# Patient Record
Sex: Male | Born: 1950 | Race: White | Hispanic: No | Marital: Married | Smoking: Current every day smoker
Health system: Southern US, Community
[De-identification: ages and names within clinical notes are randomized; demographics above are authoritative.]

## PROBLEM LIST (undated history)

## (undated) DIAGNOSIS — I10 Essential (primary) hypertension: Secondary | ICD-10-CM

## (undated) DIAGNOSIS — I2510 Atherosclerotic heart disease of native coronary artery without angina pectoris: Secondary | ICD-10-CM

## (undated) DIAGNOSIS — G4733 Obstructive sleep apnea (adult) (pediatric): Secondary | ICD-10-CM

## (undated) DIAGNOSIS — E119 Type 2 diabetes mellitus without complications: Secondary | ICD-10-CM

## (undated) DIAGNOSIS — K760 Fatty (change of) liver, not elsewhere classified: Secondary | ICD-10-CM

## (undated) HISTORY — PX: RIGHT AND LEFT HEART CATH: CATH118262

---

## 2007-12-25 ENCOUNTER — Other Ambulatory Visit: Payer: Self-pay

## 2007-12-25 ENCOUNTER — Emergency Department: Payer: Self-pay | Admitting: Emergency Medicine

## 2013-12-29 ENCOUNTER — Emergency Department: Payer: Self-pay | Admitting: Emergency Medicine

## 2013-12-29 LAB — CBC WITH DIFFERENTIAL/PLATELET
Basophil #: 0.1 10*3/uL (ref 0.0–0.1)
Basophil %: 0.8 %
Eosinophil #: 0.2 10*3/uL (ref 0.0–0.7)
Eosinophil %: 1.7 %
HCT: 39.5 % — AB (ref 40.0–52.0)
HGB: 13 g/dL (ref 13.0–18.0)
LYMPHS ABS: 2.2 10*3/uL (ref 1.0–3.6)
Lymphocyte %: 24.3 %
MCH: 28 pg (ref 26.0–34.0)
MCHC: 33 g/dL (ref 32.0–36.0)
MCV: 85 fL (ref 80–100)
MONOS PCT: 4.7 %
Monocyte #: 0.4 x10 3/mm (ref 0.2–1.0)
NEUTROS ABS: 6.2 10*3/uL (ref 1.4–6.5)
Neutrophil %: 68.5 %
Platelet: 183 10*3/uL (ref 150–440)
RBC: 4.66 10*6/uL (ref 4.40–5.90)
RDW: 13.3 % (ref 11.5–14.5)
WBC: 9.1 10*3/uL (ref 3.8–10.6)

## 2013-12-29 LAB — COMPREHENSIVE METABOLIC PANEL
ALT: 69 U/L (ref 12–78)
ANION GAP: 4 — AB (ref 7–16)
AST: 85 U/L — AB (ref 15–37)
Albumin: 3.4 g/dL (ref 3.4–5.0)
Alkaline Phosphatase: 72 U/L
BUN: 10 mg/dL (ref 7–18)
Bilirubin,Total: 0.4 mg/dL (ref 0.2–1.0)
CALCIUM: 8.7 mg/dL (ref 8.5–10.1)
CHLORIDE: 103 mmol/L (ref 98–107)
CO2: 28 mmol/L (ref 21–32)
Creatinine: 0.98 mg/dL (ref 0.60–1.30)
EGFR (Non-African Amer.): >60
GLUCOSE: 288 mg/dL — AB (ref 65–99)
Osmolality: 280 (ref 275–301)
Potassium: 3.9 mmol/L (ref 3.5–5.1)
Sodium: 135 mmol/L — ABNORMAL LOW (ref 136–145)
Total Protein: 7.5 g/dL (ref 6.4–8.2)

## 2013-12-29 LAB — URIC ACID: URIC ACID: 4.3 mg/dL (ref 3.5–7.2)

## 2019-09-08 ENCOUNTER — Ambulatory Visit: Payer: Medicare Other | Attending: Internal Medicine

## 2019-09-08 DIAGNOSIS — Z23 Encounter for immunization: Secondary | ICD-10-CM

## 2019-09-08 NOTE — Progress Notes (Signed)
   Covid-19 Vaccination Clinic  Name:  Jacob Keith    MRN: [id number] DOB: 1951-01-07  09/08/2019  Mr. Jacob Keith was observed post Covid-19 immunization for 15 minutes without incidence. He was provided with Vaccine Information Sheet and instruction to access the V-Safe system.   Mr. Jacob Keith was instructed to call 911 with any severe reactions post vaccine: [name] Kitchen Difficulty breathing  . Swelling of your face and throat  . A fast heartbeat  . A bad rash all over your body  . Dizziness and weakness    Immunizations Administered    Name Date Dose VIS Date Route   Pfizer COVID-19 Vaccine 09/08/2019 11:59 AM 0.3 mL 07/07/2019 Intramuscular   Manufacturer: [employer], [employer]   Lot: [id number]   NDC: [address]-6

## 2019-10-04 ENCOUNTER — Ambulatory Visit: Payer: Medicare Other

## 2019-10-10 ENCOUNTER — Ambulatory Visit: Payer: Medicare Other | Attending: Internal Medicine

## 2019-10-10 DIAGNOSIS — Z23 Encounter for immunization: Secondary | ICD-10-CM

## 2019-10-10 NOTE — Progress Notes (Signed)
   Covid-19 Vaccination Clinic  Name:  Jacob Keith    MRN: [id number] DOB: Jul 01, 1951  10/10/2019  [name] was observed post Covid-19 immunization for 15 minutes without incident. He was provided with Vaccine Information Sheet and instruction to access the V-Safe system.   [name] was instructed to call 911 with any severe reactions post vaccine: [name] Kitchen Difficulty breathing  . Swelling of face and throat  . A fast heartbeat  . A bad rash all over body  . Dizziness and weakness   Immunizations Administered    Name Date Dose VIS Date Route   Pfizer COVID-19 Vaccine 10/10/2019 11:21 AM 0.3 mL 07/07/2019 Intramuscular   Manufacturer: [employer], [employer]   Lot: [id number]   NDC: [address]-7

## 2021-08-25 ENCOUNTER — Ambulatory Visit
Admission: EM | Admit: 2021-08-25 | Discharge: 2021-08-25 | Disposition: A | Discharge status: Home / Self Care | Payer: Medicare HMO

## 2021-08-25 ENCOUNTER — Other Ambulatory Visit: Payer: Self-pay

## 2021-08-25 ENCOUNTER — Ambulatory Visit (INDEPENDENT_AMBULATORY_CARE_PROVIDER_SITE_OTHER): Payer: Medicare HMO

## 2021-08-25 DIAGNOSIS — J069 Acute upper respiratory infection, unspecified: Secondary | ICD-10-CM | POA: Diagnosis not present

## 2021-08-25 DIAGNOSIS — R059 Cough, unspecified: Secondary | ICD-10-CM

## 2021-08-25 DIAGNOSIS — Z20822 Contact with and (suspected) exposure to covid-19: Secondary | ICD-10-CM | POA: Diagnosis not present

## 2021-08-25 DIAGNOSIS — R0989 Other specified symptoms and signs involving the circulatory and respiratory systems: Secondary | ICD-10-CM

## 2021-08-25 MED ORDER — MOLNUPIRAVIR EUA 200MG CAPSULE: 4.0000 | ORAL_CAPSULE | Freq: Two times a day (BID) | ORAL | 0 refills | Status: AC

## 2021-08-25 MED ORDER — TIZANIDINE HCL 2 MG PO TABS: 2.0000 mg | ORAL_TABLET | Freq: Three times a day (TID) | ORAL | 0 refills | Status: AC

## 2021-08-25 NOTE — Discharge Instructions (Signed)
Covid is a virus and should steadily improve in time it can take up to 7 to 10 days before you truly start to see a turnaround however things will get better  Chest x-ray negative   Take antiviral twice a day for 5 days  May use muscle relaxer three times a  day as needed  May attempt any of the following below.     You can take Tylenol and/or Ibuprofen as needed for fever reduction and pain relief.   For cough: honey 1/2 to 1 teaspoon (you can dilute the honey in water or another fluid).  You can also use guaifenesin and dextromethorphan for cough. You can use a humidifier for chest congestion and cough.  If you don't have a humidifier, you can sit in the bathroom with the hot shower running.      For sore throat: try warm salt water gargles, cepacol lozenges, throat spray, warm tea or water with lemon/honey, popsicles or ice, or OTC cold relief medicine for throat discomfort.   For congestion: take a daily anti-histamine like Zyrtec, Claritin, and a oral decongestant, such as pseudoephedrine.  You can also use Flonase 1-2 sprays in each nostril daily.   It is important to stay hydrated: drink plenty of fluids (water, gatorade/powerade/pedialyte, juices, or teas) to keep your throat moisturized and help further relieve irritation/discomfort.

## 2021-08-25 NOTE — ED Triage Notes (Signed)
PT has been sick since Wednesday. Reports cough, chest congestion, and body aches. His daughter tested positive for COVID yesterday.

## 2021-08-25 NOTE — ED Provider Notes (Signed)
[facility]    CSN: [id number] Arrival date & time: 08/25/21  2876      History   Chief Complaint Chief Complaint  Patient presents with   Cough   Joint Pain    HPI Jacob Keith is a 71 y.o. male.   Patient presents with chills, fever, nasal congestion, rhinorrhea, sore throat, nonproductive cough, chest tightness and generalized headaches for 6 days. Known covid exposure. Has been using mucinex, aspirin which has been helpful.  History of hyperlipidemia, hypertension and diabetes.  No past medical history on file.  There are no problems to display for this patient.        Home Medications    Prior to Admission medications   Medication Sig Start Date End Date Taking? Authorizing Provider  albuterol (VENTOLIN HFA) 108 (90 Base) MCG/ACT inhaler 1 to 2 puffs as needed 05/19/10  Yes [provider]  aspirin 81 MG EC tablet Take 1 tablet by mouth daily. 08/26/18  Yes [provider]  venlafaxine XR (EFFEXOR-XR) 150 MG 24 hr capsule Take by mouth. 05/19/10  Yes [provider]  amLODipine (NORVASC) 5 MG tablet Take 5 mg by mouth daily. 07/14/21   [provider]  atorvastatin (LIPITOR) 10 MG tablet Take 10 mg by mouth daily. 07/24/21   [provider]  clonazePAM (KLONOPIN) 0.5 MG tablet Take 0.5 mg by mouth 3 (three) times daily as needed. 08/04/21   [provider]  gabapentin (NEURONTIN) 300 MG capsule Take by mouth. 05/12/21   [provider]  Magnesium 200 MG TABS Take by mouth.    [provider]  metFORMIN (GLUCOPHAGE) 1000 MG tablet Take 1,000 mg by mouth 2 (two) times daily. 07/12/21   [provider]  Oxycodone HCl 10 MG TABS Take 10 mg by mouth 3 (three) times daily as needed. 08/21/21   [provider]  tamsulosin (FLOMAX) 0.4 MG CAPS capsule Take 0.8 mg by mouth daily. 07/14/21   [provider]    Family History No family history on file.  Social  History     Allergies   Baclofen, Fentanyl, Sulfa antibiotics, and Doxycycline   Review of Systems Review of Systems  Constitutional:  Positive for chills and fever. Negative for activity change, appetite change, diaphoresis, fatigue and unexpected weight change.  HENT:  Positive for congestion, rhinorrhea and sore throat. Negative for dental problem, drooling, ear discharge, ear pain, facial swelling, hearing loss, mouth sores, nosebleeds, postnasal drip, sinus pressure, sinus pain, sneezing, tinnitus, trouble swallowing and voice change.   Respiratory:  Positive for cough and chest tightness. Negative for apnea, choking, shortness of breath, wheezing and stridor.   Cardiovascular: Negative.   Gastrointestinal: Negative.   Skin: Negative.   Neurological:  Positive for headaches. Negative for dizziness, tremors, seizures, syncope, facial asymmetry, speech difficulty, weakness, light-headedness and numbness.    Physical Exam Triage Vital Signs ED Triage Vitals  Enc Vitals Group     BP 08/25/21 0842 129/75     Pulse Rate 08/25/21 0842 65     Resp 08/25/21 0842 16     Temp 08/25/21 0842 99.5 F (37.5 C)     Temp Source 08/25/21 0842 Oral     SpO2 08/25/21 0842 99 %     Weight --      Height --      Head Circumference --      Peak Flow --      Pain Score 08/25/21 0837 6  Pain Loc --      Pain Edu? --      Excl. in [place]? --    No data found.  Updated Vital Signs BP 129/75    Pulse 65    Temp 99.5 F (37.5 C) (Oral)    Resp 16    SpO2 99%   Visual Acuity Right Eye Distance:   Left Eye Distance:   Bilateral Distance:    Right Eye Near:   Left Eye Near:    Bilateral Near:     Physical Exam Constitutional:      Appearance: Normal appearance.  HENT:     Head: Normocephalic.     Right Ear: Tympanic membrane, ear canal and external ear normal.     Left Ear: Tympanic membrane, ear canal and external ear normal.     Nose: Congestion and rhinorrhea present.      Mouth/Throat:     Mouth: Mucous membranes are moist.     Pharynx: Oropharynx is clear.  Eyes:     Extraocular Movements: Extraocular movements intact.  Cardiovascular:     Rate and Rhythm: Normal rate and regular rhythm.     Pulses: Normal pulses.     Heart sounds: Normal heart sounds.  Pulmonary:     Breath sounds: Examination of the right-lower field reveals rhonchi. Examination of the left-lower field reveals rhonchi. Rhonchi present.  Musculoskeletal:     Cervical back: Normal range of motion and neck supple.  Skin:    General: Skin is warm and dry.  Neurological:     Mental Status: He is alert and oriented to person, place, and time. Mental status is at baseline.  Psychiatric:        Mood and Affect: Mood normal.        Behavior: Behavior normal.     UC Treatments / Results  Labs (all labs ordered are listed, but only abnormal results are displayed) Labs Reviewed - No data to display  EKG   Radiology No results found.  Procedures Procedures (including critical care time)  Medications Ordered in UC Medications - No data to display  Initial Impression / Assessment and Plan / UC Course  I have reviewed the triage vital signs and the nursing notes.  Pertinent labs & imaging results that were available during my care of the patient were reviewed by me and considered in my medical decision making (see chart for details).  Viral URI with cough Exposure to COVID  Vital signs are stable, patient is in no signs of distress, based on medical history and age, will move forward with antiviral treatment, discussed administration, chest x-ray negative, discussed findings with patient, prescribed Zanaflex as body aches are most worrisome symptom, may continue use of over-the-counter medication for further symptom management, urgent care follow-up as needed Final Clinical Impressions(s) / UC Diagnoses   Final diagnoses:  None   Discharge Instructions   None    ED  Prescriptions   None    PDMP not reviewed this encounter.   Hans Eden, [place] 08/25/21 [phone]

## 2023-04-27 ENCOUNTER — Ambulatory Visit
Admission: EM | Admit: 2023-04-27 | Discharge: 2023-04-27 | Payer: Medicare HMO | Attending: Emergency Medicine | Admitting: Emergency Medicine

## 2023-04-27 ENCOUNTER — Encounter: Payer: Self-pay | Admitting: Emergency Medicine

## 2023-04-27 DIAGNOSIS — R079 Chest pain, unspecified: Secondary | ICD-10-CM | POA: Diagnosis not present

## 2023-04-27 HISTORY — DX: Atherosclerotic heart disease of native coronary artery without angina pectoris: I25.10

## 2023-04-27 HISTORY — DX: Fatty (change of) liver, not elsewhere classified: K76.0

## 2023-04-27 HISTORY — DX: Type 2 diabetes mellitus without complications: E11.9

## 2023-04-27 HISTORY — DX: Obstructive sleep apnea (adult) (pediatric): G47.33

## 2023-04-27 HISTORY — DX: Essential (primary) hypertension: I10

## 2023-04-27 NOTE — ED Triage Notes (Signed)
Pt presents with a cough, SOB and fever that started today. Pt states the right side of his chest hurts when he coughs.

## 2023-04-27 NOTE — ED Notes (Signed)
Patient is being discharged from the Urgent Care and sent to the Emergency Department via personal vehicle . Per Harl Bowie NP, patient is in need of higher level of care due to chest pain, SOB. Patient is aware and verbalizes understanding of plan of care.  Vitals:   04/27/23 1828  BP: (!) 163/71  Pulse: 73  Resp: 18  Temp: (!) 100.5 F (38.1 C)  SpO2: 95%

## 2023-04-27 NOTE — Discharge Instructions (Signed)
Please go to ER for further evaluation, do not drink or eat anything until cleared by ER provider.

## 2023-04-27 NOTE — ED Provider Notes (Signed)
[facility]    CSN: [id number] Arrival date & time: 04/27/23  1819      History   Chief Complaint Chief Complaint  Patient presents with   Cough   Fever   Shortness of Breath    HPI Jacob Keith is a 72 y.o. male.   Jacob Keith is an 72 y.o. male patient with a past medical history of HTN, CAD, OSA with CPAP, and hepatic steatosis, presents to urgent care for evaluation of cough, SOB and fever that started today. Pt states the right side of his chest hurts when he coughs, feels like a band around right side of chest. Pt took tylenol earlier today.   Heart cath 04/10/2023, daughter with pt  The history is provided by the patient and a relative. No language interpreter was used.  Cough Associated symptoms: fever and shortness of breath   Fever Associated symptoms: cough   Shortness of Breath Associated symptoms: cough and fever     Past Medical History:  Diagnosis Date   Coronary artery disease    Fatty liver    Hypertension    OSA on CPAP     Patient Active Problem List   Diagnosis Date Noted   Right-sided chest pain 04/27/2023    Past Surgical History:  Procedure Laterality Date   RIGHT AND LEFT HEART CATH         Home Medications    Prior to Admission medications   Medication Sig Start Date End Date Taking? Authorizing Provider  albuterol (VENTOLIN HFA) 108 (90 Base) MCG/ACT inhaler 1 to 2 puffs as needed 05/19/10   [provider]  amLODipine (NORVASC) 5 MG tablet Take 5 mg by mouth daily. 07/14/21   [provider]  aspirin 81 MG EC tablet Take 1 tablet by mouth daily. 08/26/18   [provider]  atorvastatin (LIPITOR) 10 MG tablet Take 10 mg by mouth daily. 07/24/21   [provider]  clonazePAM (KLONOPIN) 0.5 MG tablet Take 0.5 mg by mouth 3 (three) times daily as needed. 08/04/21   [provider]  gabapentin (NEURONTIN) 300 MG capsule Take by mouth. 05/12/21   [provider]  Magnesium 200 MG TABS Take by mouth.    [provider]  metFORMIN (GLUCOPHAGE) 1000 MG tablet Take 1,000 mg by mouth 2 (two) times daily. 07/12/21   [provider]  Oxycodone HCl 10 MG TABS Take 10 mg by mouth 3 (three) times daily as needed. 08/21/21   [provider]  tamsulosin (FLOMAX) 0.4 MG CAPS capsule Take 0.8 mg by mouth daily. 07/14/21   [provider]  tiZANidine (ZANAFLEX) 2 MG tablet Take 1 tablet (2 mg total) by mouth 3 (three) times daily. 08/25/21   Valinda Hoar, NP  venlafaxine XR (EFFEXOR-XR) 150 MG 24 hr capsule Take by mouth. 05/19/10   [provider]    Family History History reviewed. No pertinent family history.  Social History Social History   Tobacco Use   Smoking status: Former    Types: Cigarettes   Smokeless tobacco: Never  Vaping Use   Vaping status: Former  Substance Use Topics   Alcohol use: Not Currently     Allergies   Baclofen, Fentanyl, Sulfa antibiotics, Oxycodone-acetaminophen, and Doxycycline   Review of Systems Review of Systems  Constitutional:  Positive for fever.  Respiratory:  Positive for cough, chest tightness and shortness of breath.   Cardiovascular:  Negative for palpitations.  All other  systems reviewed and are negative.    Physical Exam Triage Vital Signs ED Triage Vitals [04/27/23 1825]  Encounter Vitals Group     BP      Systolic BP Percentile      Diastolic BP Percentile      Pulse      Resp      Temp      Temp src      SpO2      Weight      Height      Head Circumference      Peak Flow      Pain Score 6     Pain Loc      Pain Education      Exclude from Growth Chart    No data found.  Updated Vital Signs BP (!) 163/71 (BP Location: Left Arm)   Pulse 73   Temp (!) 100.5 F (38.1 C)   Resp 18   SpO2 95%   Visual Acuity Right Eye Distance:   Left Eye Distance:   Bilateral Distance:    Right Eye Near:   Left Eye Near:    Bilateral  Near:     Physical Exam Vitals and nursing note reviewed.  Cardiovascular:     Rate and Rhythm: Normal rate and regular rhythm.     Heart sounds: Normal heart sounds.  Pulmonary:     Effort: Pulmonary effort is normal.     Breath sounds: Examination of the right-lower field reveals decreased breath sounds. Examination of the left-lower field reveals decreased breath sounds. Decreased breath sounds present.  Neurological:     General: No focal deficit present.     Mental Status: He is alert and oriented to person, place, and time.     GCS: GCS eye subscore is 4. GCS verbal subscore is 5. GCS motor subscore is 6.  Psychiatric:        Attention and Perception: Attention normal.        Mood and Affect: Mood normal.        Speech: Speech normal.        Behavior: Behavior normal.      UC Treatments / Results  Labs (all labs ordered are listed, but only abnormal results are displayed) Labs Reviewed - No data to display  EKG   Radiology No results found.  Procedures Procedures (including critical care time)  Medications Ordered in UC Medications - No data to display  Initial Impression / Assessment and Plan / UC Course  I have reviewed the triage vital signs and the nursing notes.  Pertinent labs & imaging results that were available during my care of the patient were reviewed by me and considered in my medical decision making (see chart for details).  Clinical Course as of 04/27/23 1852  Tue Apr 27, 2023  1841 Hr shows NSR w artifact noted, rate 80, no STEMI, no recent EKG to compare in epic noted.  Daughter with patient will transport to ER. [JD]    Clinical Course User Index [JD] Emmilynn Marut, Para March, NP    Ddx: Right sided chest pain, Viral URI,pneumonia Final Clinical Impressions(s) / UC Diagnoses   Final diagnoses:  Right-sided chest pain     Discharge Instructions      Please go to ER for further evaluation, do not drink or eat anything until cleared by ER  provider.     ED Prescriptions   None    PDMP not reviewed this encounter.   Harleyquinn Gasser, Para March,  NP 04/27/23 1852

## 2023-06-11 IMAGING — CR DG CHEST 2V
4 series · 4 of 4 positions shown · non-contrast
Comparison: 12/25/2007

CLINICAL DATA: Rhonchi.  Cough and chest congestion.

EXAM:
CHEST - 2 VIEW

[chest pa (1 of 2)]
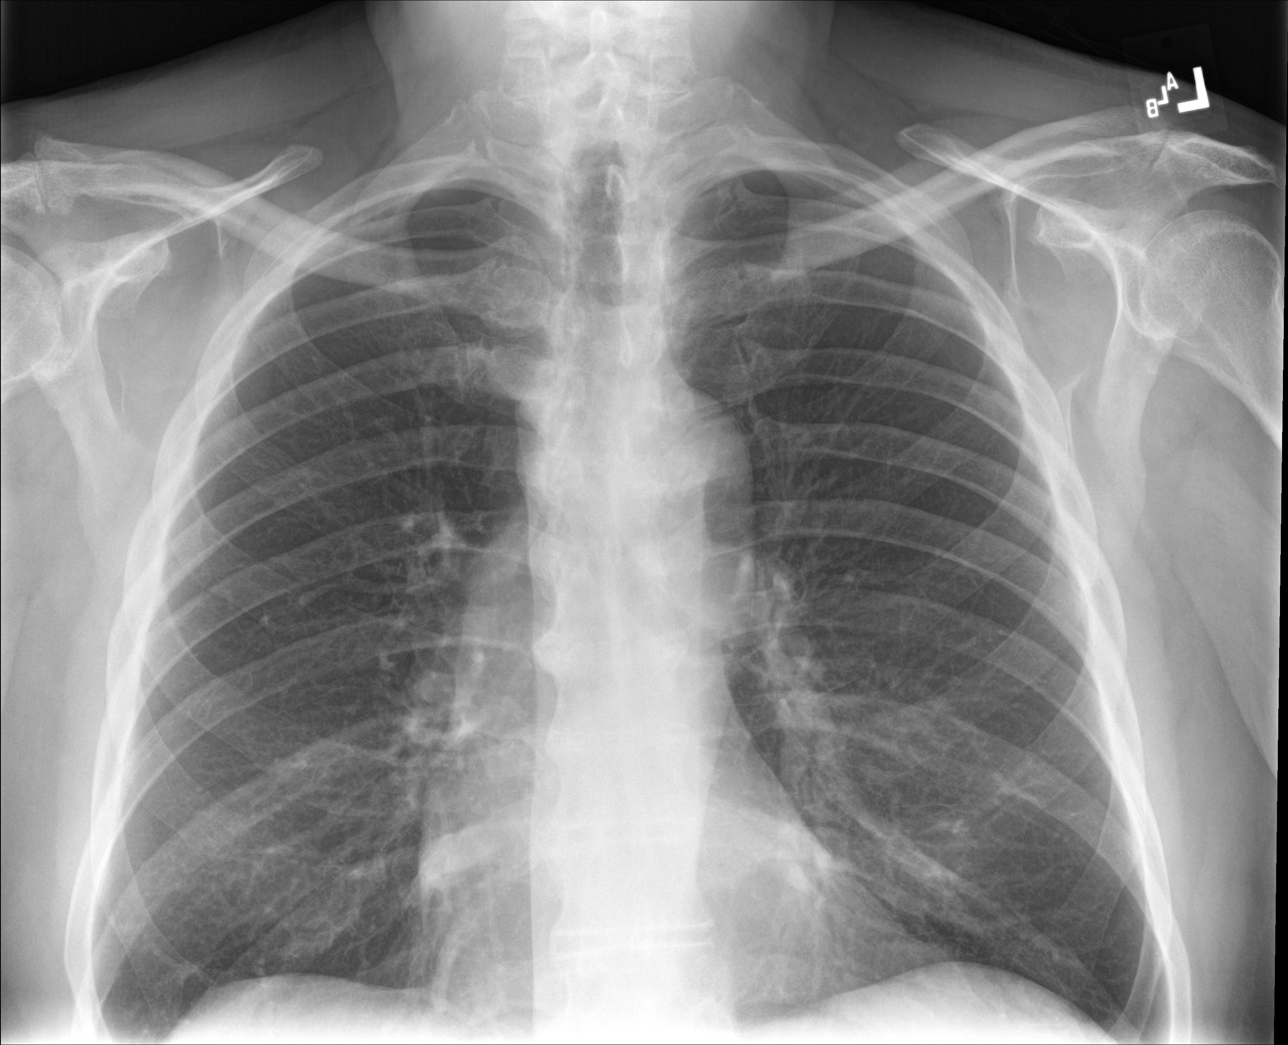

[chest lat (1 of 2)]
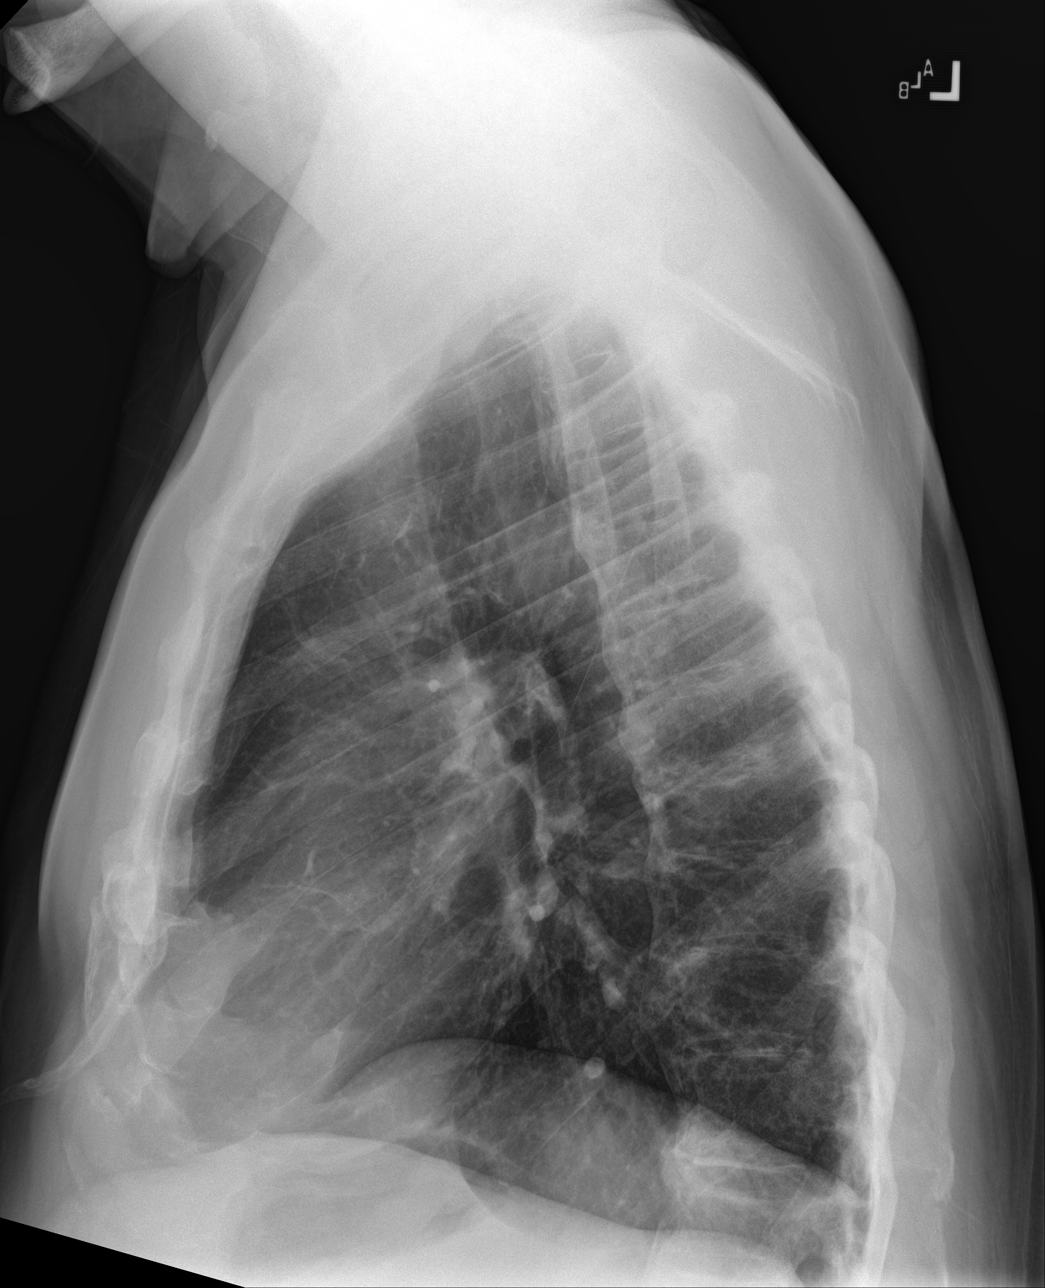

[chest pa (2 of 2)]
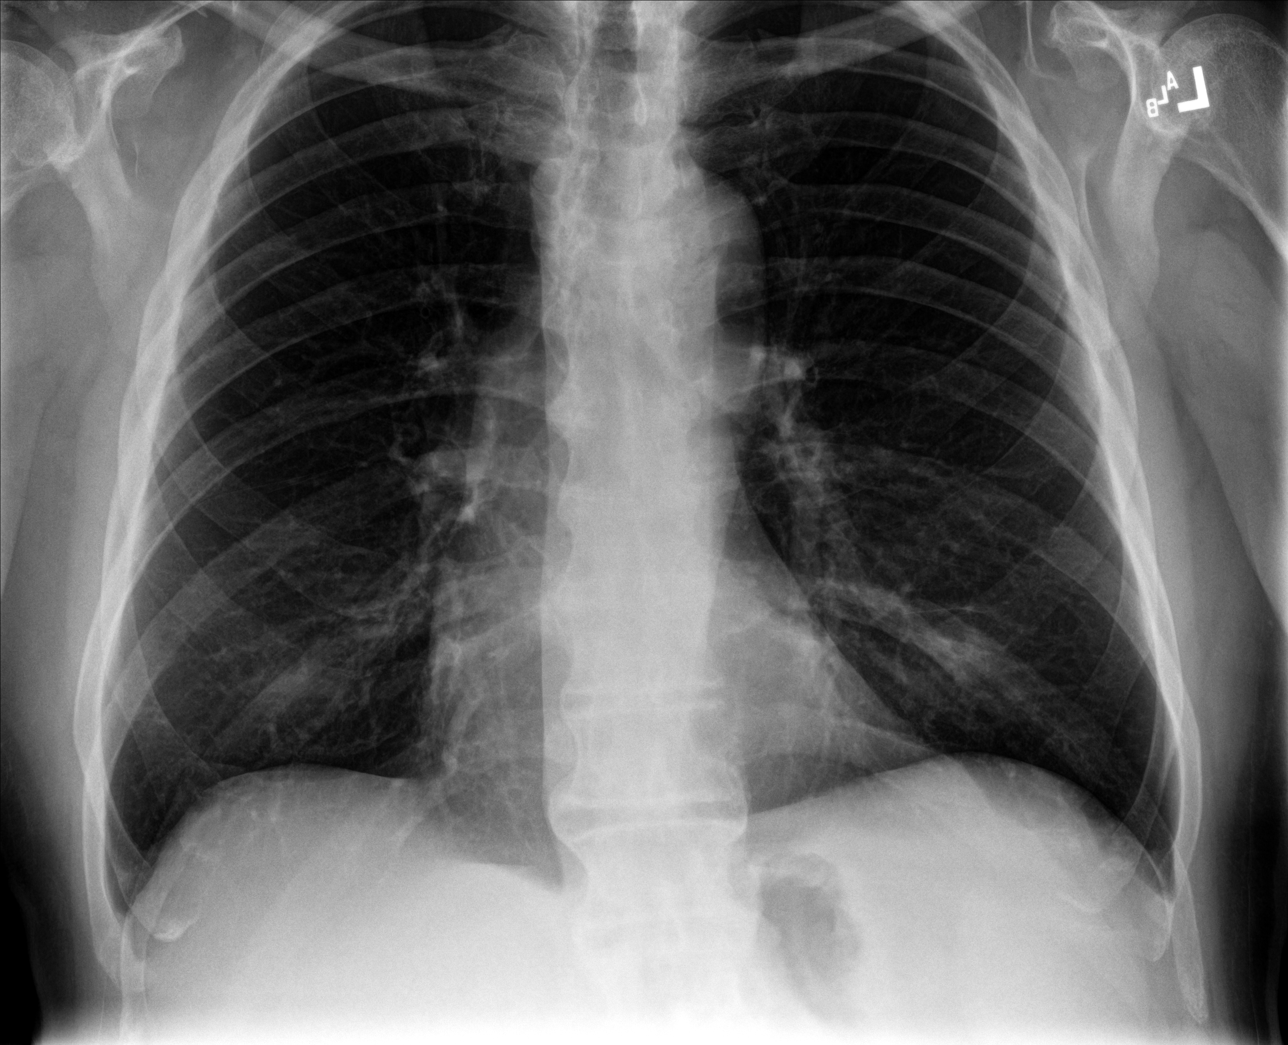

[chest lat (2 of 2)]
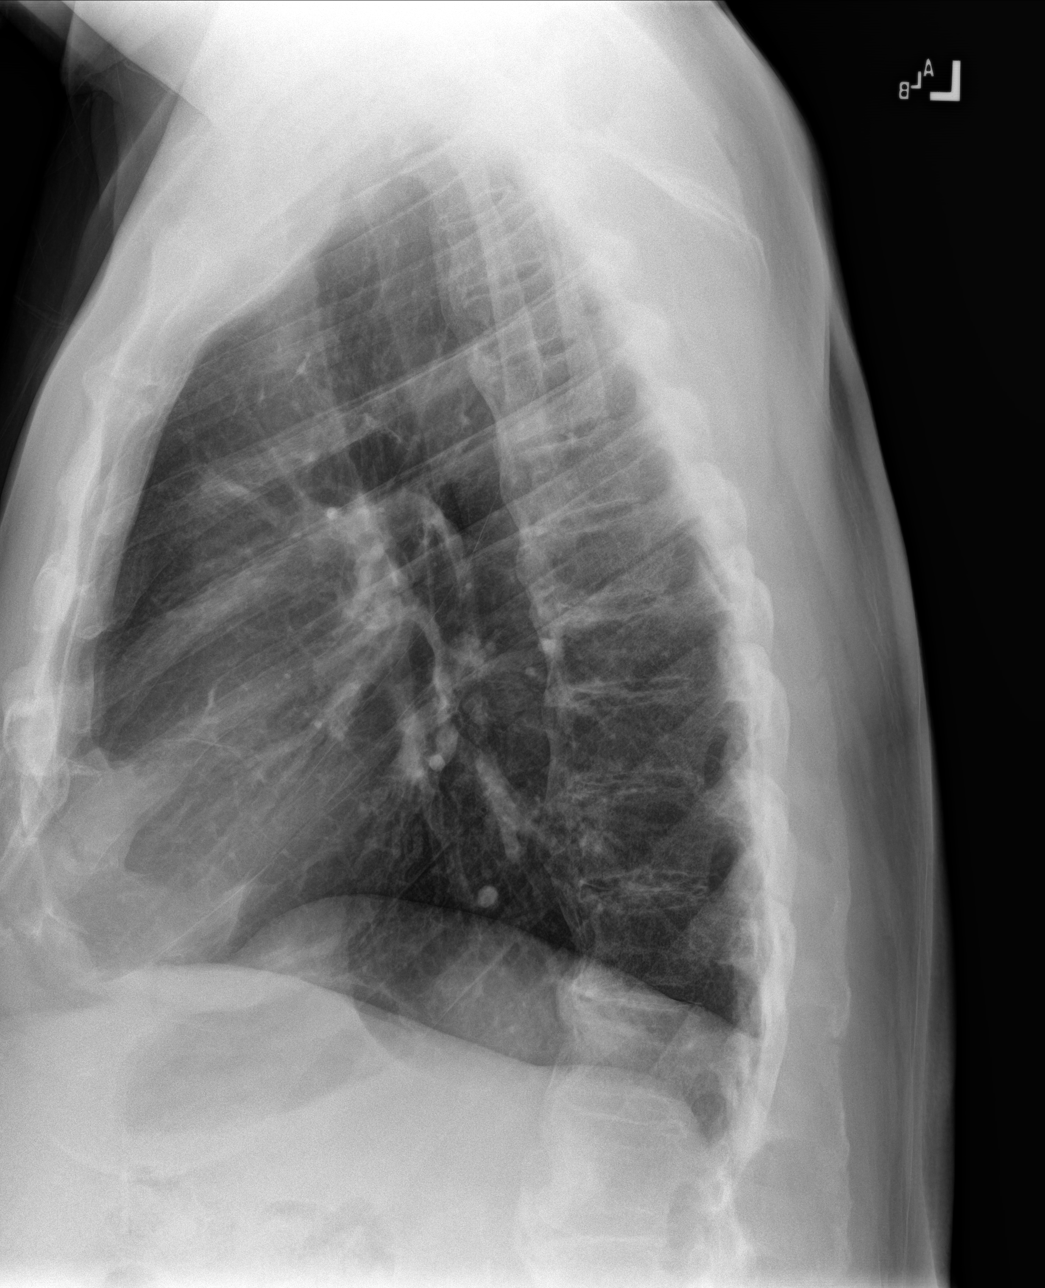

[4 of 4 positions shown; findings below may reference images not displayed]

FINDINGS: Both lungs are clear. Heart and mediastinum are within normal
limits. Bridging osteophytes in thoracic spine. No large pleural
effusions. Trachea is midline. Negative for a pneumothorax.
IMPRESSION: No active cardiopulmonary disease.

## 2024-01-08 ENCOUNTER — Encounter: Payer: Self-pay | Admitting: Emergency Medicine

## 2024-01-08 ENCOUNTER — Other Ambulatory Visit: Payer: Self-pay

## 2024-01-08 ENCOUNTER — Emergency Department

## 2024-01-08 ENCOUNTER — Emergency Department
Admission: EM | Admit: 2024-01-08 | Discharge: 2024-01-08 | Disposition: A | Discharge status: Home / Self Care | Attending: Emergency Medicine | Admitting: Emergency Medicine

## 2024-01-08 DIAGNOSIS — S51012A Laceration without foreign body of left elbow, initial encounter: Secondary | ICD-10-CM | POA: Diagnosis not present

## 2024-01-08 DIAGNOSIS — I10 Essential (primary) hypertension: Secondary | ICD-10-CM | POA: Insufficient documentation

## 2024-01-08 DIAGNOSIS — S0990XA Unspecified injury of head, initial encounter: Secondary | ICD-10-CM | POA: Diagnosis present

## 2024-01-08 DIAGNOSIS — S0181XA Laceration without foreign body of other part of head, initial encounter: Secondary | ICD-10-CM | POA: Diagnosis not present

## 2024-01-08 DIAGNOSIS — Y92009 Unspecified place in unspecified non-institutional (private) residence as the place of occurrence of the external cause: Secondary | ICD-10-CM | POA: Insufficient documentation

## 2024-01-08 DIAGNOSIS — E119 Type 2 diabetes mellitus without complications: Secondary | ICD-10-CM | POA: Insufficient documentation

## 2024-01-08 DIAGNOSIS — R55 Syncope and collapse: Secondary | ICD-10-CM | POA: Diagnosis not present

## 2024-01-08 DIAGNOSIS — W19XXXA Unspecified fall, initial encounter: Secondary | ICD-10-CM | POA: Diagnosis not present

## 2024-01-08 DIAGNOSIS — I2510 Atherosclerotic heart disease of native coronary artery without angina pectoris: Secondary | ICD-10-CM | POA: Insufficient documentation

## 2024-01-08 LAB — COMPREHENSIVE METABOLIC PANEL WITH GFR
ALT: 18 U/L (ref 0–44)
AST: 32 U/L (ref 15–41)
Albumin: 3.2 g/dL — ABNORMAL LOW (ref 3.5–5.0)
Alkaline Phosphatase: 89 U/L (ref 38–126)
Anion gap: 4 — ABNORMAL LOW (ref 5–15)
BUN: 23 mg/dL (ref 8–23)
CO2: 29 mmol/L (ref 22–32)
Calcium: 8.4 mg/dL — ABNORMAL LOW (ref 8.9–10.3)
Chloride: 108 mmol/L (ref 98–111)
Creatinine, Ser: 1.31 mg/dL — ABNORMAL HIGH (ref 0.61–1.24)
GFR, Estimated: 58 mL/min — ABNORMAL LOW (ref >60)
Glucose, Bld: 129 mg/dL — ABNORMAL HIGH (ref 70–99)
Potassium: 3.5 mmol/L (ref 3.5–5.1)
Sodium: 141 mmol/L (ref 135–145)
Total Bilirubin: 0.6 mg/dL (ref 0.0–1.2)
Total Protein: 6.3 g/dL — ABNORMAL LOW (ref 6.5–8.1)

## 2024-01-08 LAB — CBC
HCT: 33.5 % — ABNORMAL LOW (ref 39.0–52.0)
Hemoglobin: 10.8 g/dL — ABNORMAL LOW (ref 13.0–17.0)
MCH: 27.4 pg (ref 26.0–34.0)
MCHC: 32.2 g/dL (ref 30.0–36.0)
MCV: 85 fL (ref 80.0–100.0)
Platelets: 168 10*3/uL (ref 150–400)
RBC: 3.94 MIL/uL — ABNORMAL LOW (ref 4.22–5.81)
RDW: 13.8 % (ref 11.5–15.5)
WBC: 7.8 10*3/uL (ref 4.0–10.5)
nRBC: 0 % (ref 0.0–0.2)

## 2024-01-08 LAB — TROPONIN I (HIGH SENSITIVITY): Troponin I (High Sensitivity): 6 ng/L (ref <18)

## 2024-01-08 MED ORDER — SODIUM CHLORIDE 0.9 % IV BOLUS
500.0000 mL | Freq: Once | INTRAVENOUS | Status: AC
  Administered 2024-01-08: 500 mL via INTRAVENOUS

## 2024-01-08 NOTE — ED Notes (Signed)
 Laceration to left elbow cleaned. Wound wrapped with xeroform and gauze.

## 2024-01-08 NOTE — Discharge Instructions (Signed)
 As we discussed please call the number provided for cardiology or call your cardiologist Monday morning to arrange a follow-up appointment for further evaluation and consideration of a Holter monitor (cardiac monitor).  Please keep your forehead dry for the next 24 hours after which you may wash.  The glue will fall off on its own within the next couple days.  Return to the emergency department for any further episodes of lightheadedness weakness.  Please drink plenty of fluids over the next couple days and avoid exertion or heat exposure.

## 2024-01-08 NOTE — ED Provider Notes (Signed)
 [facility] Provider Note    Event Date/Time   First MD Initiated Contact with Patient 01/08/24 1606     (approximate)  History   Chief Complaint: Loss of Consciousness  HPI  Jacob Keith is a 73 y.o. male with a past medical history of CAD, hypertension, diabetes, presents to the emergency department after a syncopal episode.  According to the patient he was outside washing his car, states he had been out in the heat for quite some time he was attempting to go back inside when he became very lightheaded dizzy and had a syncopal event falling to the ground hitting his head.  Patient has a small laceration to the center of his forehead as well as an abrasion to the left cheek.  Patient states over the last couple weeks he has been experiencing episodes of dizziness/lightheadedness.  States he has seen his doctor for the same who has decreased his amlodipine from 10 mg daily to 5 mg daily in an attempt to prevent these episodes.  According to report EMS states initial blood pressure of 88/51 and the patient was given 1 L of fluids prior to arrival.  During my evaluation patient is feeling much better blood pressure currently 136/74.  Patient denies any symptoms at this time denies any weakness or lightheadedness.  Denies any chest pain or shortness of breath at any point.  Physical Exam   Triage Vital Signs: ED Triage Vitals  Encounter Vitals Group     BP 01/08/24 1447 (!) 94/54     Girls Systolic BP Percentile --      Girls Diastolic BP Percentile --      Boys Systolic BP Percentile --      Boys Diastolic BP Percentile --      Pulse Rate 01/08/24 1447 67     Resp 01/08/24 1447 16     Temp 01/08/24 1447 98.4 F (36.9 C)     Temp Source 01/08/24 1447 Oral     SpO2 01/08/24 1447 98 %     Weight 01/08/24 1446 190 lb (86.2 kg)     Height 01/08/24 1446 5' 8 (1.727 m)     Head Circumference --      Peak Flow --      Pain Score 01/08/24 1445 5     Pain Loc --       Pain Education --      Exclude from Growth Chart --     Most recent vital signs: Vitals:   01/08/24 1447 01/08/24 1603  BP: (!) 94/54 136/74  Pulse: 67 61  Resp: 16 17  Temp: 98.4 F (36.9 C)   SpO2: 98% 100%    General: Awake, no distress.  Patient has an approximate 2 cm vertical laceration to the center to left side of his forehead that is not gaping and hemostatic.  Small abrasion to the left cheek. CV:  Good peripheral perfusion.  Regular rate and rhythm  Resp:  Normal effort.  Equal breath sounds bilaterally.  Abd:  No distention.  Soft, nontender.  No rebound or guarding. Other:  Small skin tear to left elbow.  Good range of motion in all extremities.   ED Results / Procedures / Treatments   EKG  EKG viewed and interpreted by myself shows a normal sinus rhythm at 67 bpm with a narrow QRS, normal axis, normal intervals, no concerning ST changes.  Reassuring EKG.  RADIOLOGY  I have reviewed interpreted CT head images.  No obvious bleed seen on my evaluation. Radiology has read the CT scan of the head and C-spine as negative for acute abnormality   MEDICATIONS ORDERED IN ED: Medications  sodium chloride 0.9 % bolus 500 mL (0 mLs Intravenous Stopped 01/08/24 1615)     IMPRESSION / MDM / ASSESSMENT AND PLAN / ED COURSE  I reviewed the triage vital signs and the nursing notes.  Patient's presentation is most consistent with acute presentation with potential threat to life or bodily function.  Patient presents emergency department after syncopal episode while outside.  Patient states over the last couple weeks he has been experiencing these episodes of weakness/lightheadedness but this is the first time he is actually passed out.  His doctor recently decreased his amlodipine from 10 mg to 5 mg in an attempt to prevent these episodes.  Patient was initially hypotensive per EMS was given some fluids prior to arrival patient's blood pressures currently in the 130s  during my evaluation.  Patient denies any symptoms while lying in bed.  CBC is reassuring, chemistry shows slight renal insufficiency otherwise reassuring.  CT scans of the head and C-spine are negative for acute abnormality.  We will IV hydrate we will obtain a troponin as the patient has been in the emergency department nearly 2 and half hours at this time.  Patient's laceration to the forehead is vertical and not gaping we will apply Dermabond after cleaning the area.  As long as the patient's remainder of the labs do not appear to show any significant abnormality I believe the patient could be discharged home with outpatient follow-up with cardiology for Holter monitor.  Patient states he just [facility] care [facility].  Will refer to our cardiologist but he will call his cardiologist on Monday to see if he can get into the office within the next couple days to have a Holter monitor placed.  We will also discontinue the patient's amlodipine until the patient can be seen by his doctor.  Discussed supportive care at home including plenty of fluids and avoiding significant heat.  Patient agreeable to this plan.  The remainder of the patient's workup has resulted reassuring including a negative troponin.  I have cleaned the wounds apply Dermabond to the forehead.  Patient tolerated well.  Will wrap the patient's left elbow skin tear with Xeroform and dressing.  Will discharge with PCP follow-up.  Discussed with the patient to discontinue amlodipine until he can see his primary care doctor as well as increase the amount of fluids he is consuming and avoid heat or exertion over the weekend.  FINAL CLINICAL IMPRESSION(S) / ED DIAGNOSES   Syncope   Note:  This document was prepared using Dragon voice recognition software and may include unintentional dictation errors.   Ruth Cove, MD 01/08/24 [phone]

## 2024-01-08 NOTE — ED Triage Notes (Addendum)
 Pt via [facility] from home. Pt was outside washing his windows when he had a syncopal episode. Pt fell on gravel. + head injury. Denies blood thinners. Pt recently has lost a lot of weight and has not had any adjustments to his BP meds, medications were taken this morning. Pt has abrasion to the L side of the forehead and under the L eye. Bleeding controlled. Pt is A&Ox4 and NAD. Pt is A&Ox4 and NAD  Initial BP 88/51, pt was orthostatic per EMS. EMS gave 1L of NS.  134 CBG  69 HR  96% on RA

## 2024-01-08 NOTE — ED Notes (Signed)
 Discharge instructions reviewed with patient and family. Patient and family voiced understanding of all instructions.

## 2024-06-01 ENCOUNTER — Emergency Department

## 2024-06-01 ENCOUNTER — Other Ambulatory Visit: Payer: Self-pay

## 2024-06-01 DIAGNOSIS — S50812A Abrasion of left forearm, initial encounter: Secondary | ICD-10-CM | POA: Diagnosis present

## 2024-06-01 DIAGNOSIS — I10 Essential (primary) hypertension: Secondary | ICD-10-CM | POA: Diagnosis not present

## 2024-06-01 DIAGNOSIS — S60512A Abrasion of left hand, initial encounter: Secondary | ICD-10-CM | POA: Diagnosis not present

## 2024-06-01 DIAGNOSIS — W1839XA Other fall on same level, initial encounter: Secondary | ICD-10-CM | POA: Diagnosis not present

## 2024-06-01 DIAGNOSIS — W010XXA Fall on same level from slipping, tripping and stumbling without subsequent striking against object, initial encounter: Secondary | ICD-10-CM | POA: Insufficient documentation

## 2024-06-01 DIAGNOSIS — E119 Type 2 diabetes mellitus without complications: Secondary | ICD-10-CM | POA: Diagnosis not present

## 2024-06-01 DIAGNOSIS — I2510 Atherosclerotic heart disease of native coronary artery without angina pectoris: Secondary | ICD-10-CM | POA: Diagnosis not present

## 2024-06-01 DIAGNOSIS — S0990XA Unspecified injury of head, initial encounter: Secondary | ICD-10-CM | POA: Diagnosis not present

## 2024-06-01 DIAGNOSIS — S6992XA Unspecified injury of left wrist, hand and finger(s), initial encounter: Secondary | ICD-10-CM | POA: Diagnosis present

## 2024-06-01 DIAGNOSIS — Z23 Encounter for immunization: Secondary | ICD-10-CM | POA: Diagnosis not present

## 2024-06-01 DIAGNOSIS — Z5321 Procedure and treatment not carried out due to patient leaving prior to being seen by health care provider: Secondary | ICD-10-CM | POA: Insufficient documentation

## 2024-06-01 DIAGNOSIS — S61412A Laceration without foreign body of left hand, initial encounter: Secondary | ICD-10-CM | POA: Diagnosis not present

## 2024-06-01 NOTE — ED Triage Notes (Signed)
 Patient ambulatory to triage with complaints of fall tonight. Patient tripped on some wood and sawhorses in the dark. Endorses hitting his head, denies LOC. Patient presents with scrapes to left forearm and hand. The hand is pretty severe and will need repair. Denies thinners.

## 2024-06-02 ENCOUNTER — Emergency Department
Admission: EM | Admit: 2024-06-02 | Discharge: 2024-06-02 | Disposition: A | Discharge status: Home / Self Care | Source: Home / Self Care | Attending: Emergency Medicine | Admitting: Emergency Medicine

## 2024-06-02 ENCOUNTER — Emergency Department
Admission: EM | Admit: 2024-06-02 | Discharge: 2024-06-02 | Discharge status: Against Medical Advice | Attending: Emergency Medicine | Admitting: Emergency Medicine

## 2024-06-02 ENCOUNTER — Ambulatory Visit: Admission: EM | Admit: 2024-06-02 | Discharge: 2024-06-02 | Disposition: A | Discharge status: Home / Self Care

## 2024-06-02 DIAGNOSIS — W1839XA Other fall on same level, initial encounter: Secondary | ICD-10-CM | POA: Insufficient documentation

## 2024-06-02 DIAGNOSIS — I10 Essential (primary) hypertension: Secondary | ICD-10-CM | POA: Insufficient documentation

## 2024-06-02 DIAGNOSIS — S61412A Laceration without foreign body of left hand, initial encounter: Secondary | ICD-10-CM | POA: Insufficient documentation

## 2024-06-02 DIAGNOSIS — Z23 Encounter for immunization: Secondary | ICD-10-CM | POA: Insufficient documentation

## 2024-06-02 DIAGNOSIS — W19XXXA Unspecified fall, initial encounter: Secondary | ICD-10-CM

## 2024-06-02 DIAGNOSIS — E119 Type 2 diabetes mellitus without complications: Secondary | ICD-10-CM | POA: Insufficient documentation

## 2024-06-02 DIAGNOSIS — I2510 Atherosclerotic heart disease of native coronary artery without angina pectoris: Secondary | ICD-10-CM | POA: Insufficient documentation

## 2024-06-02 MED ORDER — CEPHALEXIN 500 MG PO CAPS
500.0000 mg | ORAL_CAPSULE | Freq: Once | ORAL | Status: AC
  Administered 2024-06-02: 500 mg via ORAL
  Filled 2024-06-02: qty 1

## 2024-06-02 MED ORDER — LIDOCAINE HCL (PF) 1 % IJ SOLN: 5.0000 mL | Freq: Once | INTRAMUSCULAR | Status: DC

## 2024-06-02 MED ORDER — CEPHALEXIN 500 MG PO CAPS: 500.0000 mg | ORAL_CAPSULE | Freq: Three times a day (TID) | ORAL | 0 refills | Status: AC

## 2024-06-02 MED ORDER — LIDOCAINE HCL (PF) 1 % IJ SOLN
10.0000 mL | Freq: Once | INTRAMUSCULAR | Status: AC
  Administered 2024-06-02: 10 mL
  Filled 2024-06-02: qty 10

## 2024-06-02 MED ORDER — TETANUS-DIPHTH-ACELL PERTUSSIS 5-2-15.5 LF-MCG/0.5 IM SUSP
0.5000 mL | Freq: Once | INTRAMUSCULAR | Status: AC
  Administered 2024-06-02: 0.5 mL via INTRAMUSCULAR
  Filled 2024-06-02: qty 0.5

## 2024-06-02 NOTE — ED Provider Notes (Signed)
 [facility] Emergency Department Provider Note     Event Date/Time   First MD Initiated Contact with Patient 06/02/24 1103     (approximate)   History   Laceration and Fall   HPI  Jacob Keith is a 73 y.o. male with a history of diabetes, HTN, CAD, and OSA, returns to the ED for evaluation management of a hand laceration.  Patient sustained a left palm laceration following a mechanical fall last night at approximately 7 PM.  He initially presented here to the ED, but left prior to completing care, due to the protracted wait.  He did have radiology images performed which are now available for review.  Patient's primary complaint is a laceration to the palmar aspect of his thumb and a skin tear to his thenar prominence.  He then presented to a local urgent care this morning, who advised patient to return to the ED for wound care due to the fact that it had been greater than 15 hours since the incident.  Patient presents in no acute distress.  He reports an out-of-date tetanus status.  Physical Exam   Triage Vital Signs: ED Triage Vitals  Encounter Vitals Group     BP 06/02/24 1029 (!) 156/73     Girls Systolic BP Percentile --      Girls Diastolic BP Percentile --      Boys Systolic BP Percentile --      Boys Diastolic BP Percentile --      Pulse Rate 06/02/24 1029 66     Resp 06/02/24 1029 18     Temp 06/02/24 1029 98 F (36.7 C)     Temp Source 06/02/24 1029 Oral     SpO2 06/02/24 1029 100 %     Weight 06/02/24 1029 195 lb (88.5 kg)     Height 06/02/24 1029 5' 8 (1.727 m)     Head Circumference --      Peak Flow --      Pain Score 06/02/24 1035 5     Pain Loc --      Pain Education --      Exclude from Growth Chart --     Most recent vital signs: Vitals:   06/02/24 1029  BP: (!) 156/73  Pulse: 66  Resp: 18  Temp: 98 F (36.7 C)  SpO2: 100%    General Awake, no distress. NAD HEENT NCAT. PERRL. EOMI. No rhinorrhea. Mucous  membranes are moist.  CV:  Good peripheral perfusion. RRR RESP:  Normal effort. CTA MSK:  Left palm with a large irregular flap to the proximal phalanx. Superficial skin tear to the hypothenar prominence. Normal composite fist.   ED Results / Procedures / Treatments   Labs (all labs ordered are listed, but only abnormal results are displayed) Labs Reviewed - No data to display   EKG   RADIOLOGY  I personally viewed and evaluated these images as part of my medical decision making, as well as reviewing the written report by the radiologist.  ED Provider Interpretation: no acute findings  DG Thoracic Spine 2 View Result Date: 06/01/2024 CLINICAL DATA:  Status post fall. EXAM: THORACIC SPINE 2 VIEWS COMPARISON:  None Available. FINDINGS: There is no evidence of acute thoracic spine fracture. Alignment is normal. Moderate to marked severity multilevel endplate sclerosis is seen throughout the thoracic spine with moderate severity multilevel intervertebral disc space narrowing. IMPRESSION: Moderate to marked severity multilevel degenerative changes. Electronically Signed   By: Suzen  Houston M.D.   On: 06/01/2024 22:25   DG Lumbar Spine Complete Result Date: 06/01/2024 CLINICAL DATA:  Status post fall. EXAM: LUMBAR SPINE - COMPLETE 4+ VIEW COMPARISON:  None Available. FINDINGS: There is no evidence of an acute lumbar spine fracture. Alignment is normal. Marked severity multilevel endplate sclerosis and osteophyte formation are seen throughout all levels of the lumbar spine. Bilateral facet joint hypertrophy is seen at the levels of L4-L5 and L5-S1. Mild intervertebral disc space narrowing is present throughout the lumbar spine. IMPRESSION: Marked severity multilevel degenerative changes, as described above. Electronically Signed   By: Suzen Dials M.D.   On: 06/01/2024 22:23   DG Hand Complete Left Result Date: 06/01/2024 CLINICAL DATA:  Clemens, pain EXAM: LEFT HAND - COMPLETE 3+ VIEW;  LEFT FOREARM - 2 VIEW COMPARISON:  None Available. FINDINGS: Left forearm: Frontal and lateral views are obtained. No evidence of acute fracture. Alignment of the elbow and wrist is anatomic. Soft tissues are unremarkable. Left hand: Frontal, oblique, and lateral views of the left hand are obtained on 5 images. No acute fracture, subluxation, or dislocation. 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 Dorsal soft tissue swelling of the hand at the level of the metacarpophalangeal joints. IMPRESSION: 1. Dorsal soft tissue swelling of the hand. 2. 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 3. No evidence of acute fracture within the left hand or left forearm. Electronically Signed   By: Ozell Daring M.D.   On: 06/01/2024 22:20   DG Forearm Left Result Date: 06/01/2024 CLINICAL DATA:  Clemens, pain EXAM: LEFT HAND - COMPLETE 3+ VIEW; LEFT FOREARM - 2 VIEW COMPARISON:  None Available. FINDINGS: Left forearm: Frontal and lateral views are obtained. No evidence of acute fracture. Alignment of the elbow and wrist is anatomic. Soft tissues are unremarkable. Left hand: Frontal, oblique, and lateral views of the left hand are obtained on 5 images. No acute fracture, subluxation, or dislocation. 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 Dorsal soft tissue swelling of the hand at the level of the metacarpophalangeal joints. IMPRESSION: 1. Dorsal soft tissue swelling of the hand. 2. 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 3. No evidence of acute fracture within the left hand or left forearm. Electronically Signed   By: Ozell Daring M.D.   On: 06/01/2024 22:20   CT HEAD WO CONTRAST ( ) Result Date: 06/01/2024 EXAM: CT HEAD AND CERVICAL SPINE 06/01/2024 09:45:25 PM TECHNIQUE: CT of the head and cervical spine was performed without the administration of intravenous contrast. Multiplanar reformatted images are provided  for review. Automated exposure control, iterative reconstruction, and/or weight based adjustment of the mA/kV was utilized to reduce the radiation dose to as low as reasonably achievable. COMPARISON: None available. CLINICAL HISTORY: Facial trauma, blunt FINDINGS: CT HEAD BRAIN AND VENTRICLES: No acute intracranial hemorrhage. No mass effect or midline shift. No abnormal extra-axial fluid collection. No evidence of acute infarct. No hydrocephalus. ORBITS: No acute abnormality. SINUSES AND MASTOIDS: No acute abnormality. SOFT TISSUES AND SKULL: No acute skull fracture. No acute soft tissue abnormality. CT CERVICAL SPINE BONES AND ALIGNMENT: No acute fracture or traumatic malalignment. DEGENERATIVE CHANGES: Facet and uncovertebral hypertrophy contributes to varying degrees of neural foraminal stenosis. SOFT TISSUES: No prevertebral soft tissue swelling. IMPRESSION: 1. No acute intracranial abnormality. 2. No acute fracture or traumatic malalignment of the cervical spine. Electronically signed by: Gilmore Molt MD 06/01/2024 10:09 PM EST RP Workstation: [id number]   CT Cervical Spine Wo Contrast Result Date:  06/01/2024 EXAM: CT HEAD AND CERVICAL SPINE 06/01/2024 09:45:25 PM TECHNIQUE: CT of the head and cervical spine was performed without the administration of intravenous contrast. Multiplanar reformatted images are provided for review. Automated exposure control, iterative reconstruction, and/or weight based adjustment of the mA/kV was utilized to reduce the radiation dose to as low as reasonably achievable. COMPARISON: None available. CLINICAL HISTORY: Facial trauma, blunt FINDINGS: CT HEAD BRAIN AND VENTRICLES: No acute intracranial hemorrhage. No mass effect or midline shift. No abnormal extra-axial fluid collection. No evidence of acute infarct. No hydrocephalus. ORBITS: No acute abnormality. SINUSES AND MASTOIDS: No acute abnormality. SOFT TISSUES AND SKULL: No acute skull fracture. No acute soft tissue  abnormality. CT CERVICAL SPINE BONES AND ALIGNMENT: No acute fracture or traumatic malalignment. DEGENERATIVE CHANGES: Facet and uncovertebral hypertrophy contributes to varying degrees of neural foraminal stenosis. SOFT TISSUES: No prevertebral soft tissue swelling. IMPRESSION: 1. No acute intracranial abnormality. 2. No acute fracture or traumatic malalignment of the cervical spine. Electronically signed by: Gilmore Molt MD 06/01/2024 10:09 PM EST RP Workstation: [id number]     PROCEDURES:  Critical Care performed: No  .Laceration Repair  Date/Time: 06/02/2024 12:09 PM  Performed by: Loyd Candida LULLA Aldona, PA-C Authorized by: Loyd Candida LULLA Aldona, PA-C   Consent:    Consent obtained:  Verbal   Consent given by:  Patient   Risks, benefits, and alternatives were discussed: yes     Risks discussed:  Infection, pain, poor wound healing and retained foreign body Universal protocol:    Test results available: yes     Imaging studies available: yes     Site/side marked: yes     Patient identity confirmed:  Verbally with patient Anesthesia:    Anesthesia method:  Local infiltration   Local anesthetic:  Lidocaine 1% w/o epi Laceration details:    Location:  Hand   Hand location:  L palm   Length (cm):  4   Depth (mm):  5 Pre-procedure details:    Preparation:  Patient was prepped and draped in usual sterile fashion Exploration:    Limited defect created (wound extended): no     Hemostasis achieved with:  Direct pressure   Wound extent: foreign bodies/material     Contaminated: yes   Treatment:    Area cleansed with:  Povidone-iodine and saline   Amount of cleaning:  Extensive   Irrigation solution:  Sterile saline   Irrigation volume:  1L   Irrigation method:  Syringe   Visualized foreign bodies/material removed: yes     Debridement:  None   Undermining:  None Skin repair:    Repair method:  Sutures   Suture size:  5-0   Suture material:  Nylon   Suture technique:   Simple interrupted   Number of sutures:  9 Approximation:    Approximation:  Close Repair type:    Repair type:  Simple Post-procedure details:    Dressing:  Non-adherent dressing and bulky dressing   Procedure completion:  Tolerated well, no immediate complications    MEDICATIONS ORDERED IN ED: Medications  Tdap (ADACEL) injection 0.5 mL (0.5 mLs Intramuscular Given 06/02/24 1243)  lidocaine (PF) (XYLOCAINE) 1 % injection 10 mL (10 mLs Infiltration Given 06/02/24 1242)  cephALEXin (KEFLEX) capsule 500 mg (500 mg Oral Given 06/02/24 1242)     IMPRESSION / MDM / ASSESSMENT AND PLAN / ED COURSE  I reviewed the triage vital signs and the nursing notes.  Differential diagnosis includes, but is not limited to, abrasion, laceration, contusion  Patient's presentation is most consistent with acute, uncomplicated illness.  Patient's diagnosis is consistent with left hand laceration with delayed closure.  Patient presents greater than 18 hours after the initial incident, for delayed wound closure.  We discussed concern for infection, and patient agreed to proceed with wound repair.  The wound was flushed extensively, and gross foreign body/debris was removed.  Wound edges were approximated with nylon sutures and loosely closed.  Wound was dressed with nonstick Xeroform and Coban dressing.  Patient will be discharged home with prescriptions for Keflex. Patient is to follow up with his PCP for interim wound check and suture removal as discussed, as needed or otherwise directed. Patient is given ED precautions to return to the ED for any worsening or new symptoms.   FINAL CLINICAL IMPRESSION(S) / ED DIAGNOSES   Final diagnoses:  Laceration of left hand without foreign body, initial encounter     Rx / DC Orders   ED Discharge Orders          Ordered    cephALEXin (KEFLEX) 500 MG capsule  3 times daily        06/02/24 1137             Note:  This  document was prepared using Dragon voice recognition software and may include unintentional dictation errors.    Loyd Candida LULLA Aldona, PA-C 06/02/24 1322    Dicky Anes, MD 06/02/24 1956

## 2024-06-02 NOTE — ED Triage Notes (Signed)
 Pt c/o left arm injury   Pt states that he was cut by metal roofing after falling  Pt states that he can not raise his left shoulder.   Pt has taken oxycodone for pain

## 2024-06-02 NOTE — ED Notes (Signed)
 Patient approached [facility] desk at this time stating he felt he could no longer wait due to pain and needing to lay down to rest. This RN encouraged patient to be seen at his earliest convenience with stated understanding. Patient exited ER with stable gait and in no acute distress.

## 2024-06-02 NOTE — ED Provider Notes (Signed)
  73 y/o male presents for lacerations of left thumb, palm and forearm following accidental fall >15 hours ago. Patient initially went to [facility] last night, but left due to wait times. He had numerous images done including: x-rays of lumbar spine and thoracic spin, x-ray left forearm and hand. CT of head and c-spine. X-rays negative for fracture and CTs negative for acute findings.   See images included in chart. Patient has significant flap laceration of the left thumb with exposed fatty tissue. Appears to have good range of motion.   Advised patient to return to ER as this will need to be repaired and since it has been >15 hours is a great risk for infection. Will need wash out and possibly layered repair depending on underlying damage. Patient will return to [facility].         Arvis Jolan NOVAK, PA-C 06/02/24 [id number]

## 2024-06-02 NOTE — ED Notes (Signed)
 See triage note  Presents s/p fall from last pm  States he may have cut his hand on some metal in the yard   Has laceration to left hand and abrasions to forearm

## 2024-06-02 NOTE — Discharge Instructions (Signed)
 Keep the wound clean, dry, and covered as discussed.  Use only soap and water to cleanse the wound daily as needed.  Take antibiotic 3 times daily as directed.  Follow-up with primary provider for interim wound check or return to the ED for signs of erythema, swelling, purulent drainage, or streaking up the arm.  See your PCP in 1 week for suture removal.

## 2024-06-02 NOTE — Discharge Instructions (Signed)
-  Please go back to ER for you complicated laceration.   You have been advised to follow up immediately in the emergency department for concerning signs.symptoms. If you declined EMS transport, please have a family member take you directly to the ED at this time. Do not delay. Based on concerns about condition, if you do not follow up in th e ED, you may risk poor outcomes including worsening of condition, delayed treatment and potentially life threatening issues. If you have declined to go to the ED at this time, you should call your PCP immediately to set up a follow up appointment.  Go to ED for red flag symptoms, including; fevers you cannot reduce with Tylenol/Motrin, severe headaches, vision changes, numbness/weakness in part of the body, lethargy, confusion, intractable vomiting, severe dehydration, chest pain, breathing difficulty, severe persistent abdominal or pelvic pain, signs of severe infection (increased redness, swelling of an area), feeling faint or passing out, dizziness, etc. You should especially go to the ED for sudden acute worsening of condition if you do not elect to go at this time.

## 2024-06-02 NOTE — ED Triage Notes (Signed)
 Pt to ED from urgent care, LWBS in ED yesterday. Pt initially had a fall 5 ft, left shoulder pain, laceration to left hand and forearm. Sent from [facility] for repair, tetanus shot and wounds to be washed out. Negative scans from yesterday
